# Patient Record
Sex: Female | Born: 1945 | Race: Black or African American | Hispanic: No | Smoking: Former smoker
Health system: Southern US, Community
[De-identification: ages and names within clinical notes are randomized; demographics above are authoritative.]

## PROBLEM LIST (undated history)

## (undated) DIAGNOSIS — I10 Essential (primary) hypertension: Secondary | ICD-10-CM

## (undated) HISTORY — PX: BACK SURGERY: SHX140

## (undated) HISTORY — PX: ABDOMINAL HYSTERECTOMY: SHX81

## (undated) HISTORY — DX: Essential (primary) hypertension: I10

## (undated) HISTORY — PX: BRAIN TUMOR EXCISION: SHX577

---

## 1997-12-14 ENCOUNTER — Other Ambulatory Visit: Admission: RE | Admit: 1997-12-14 | Discharge: 1997-12-14 | Payer: Self-pay | Admitting: Obstetrics

## 1998-09-19 ENCOUNTER — Ambulatory Visit (HOSPITAL_COMMUNITY): Admission: RE | Admit: 1998-09-19 | Discharge: 1998-09-19 | Payer: Self-pay | Admitting: *Deleted

## 1998-09-19 ENCOUNTER — Encounter: Payer: Self-pay | Admitting: Neurosurgery

## 1999-03-12 ENCOUNTER — Encounter: Payer: Self-pay | Admitting: Nephrology

## 1999-03-12 ENCOUNTER — Encounter: Admission: RE | Admit: 1999-03-12 | Discharge: 1999-03-12 | Payer: Self-pay | Admitting: Nephrology

## 1999-08-27 ENCOUNTER — Emergency Department (HOSPITAL_COMMUNITY): Admission: EM | Admit: 1999-08-27 | Discharge: 1999-08-27 | Payer: Self-pay | Admitting: Emergency Medicine

## 1999-08-28 ENCOUNTER — Encounter: Payer: Self-pay | Admitting: Emergency Medicine

## 2003-03-23 ENCOUNTER — Other Ambulatory Visit: Admission: RE | Admit: 2003-03-23 | Discharge: 2003-03-23 | Payer: Self-pay | Admitting: Nephrology

## 2003-12-23 ENCOUNTER — Encounter: Admission: RE | Admit: 2003-12-23 | Discharge: 2003-12-23 | Payer: Self-pay | Admitting: Gastroenterology

## 2004-11-12 ENCOUNTER — Encounter: Admission: RE | Admit: 2004-11-12 | Discharge: 2004-11-12 | Payer: Self-pay | Admitting: Nephrology

## 2004-11-13 ENCOUNTER — Other Ambulatory Visit: Admission: RE | Admit: 2004-11-13 | Discharge: 2004-11-13 | Payer: Self-pay | Admitting: Nephrology

## 2004-11-13 ENCOUNTER — Other Ambulatory Visit: Admission: RE | Admit: 2004-11-13 | Discharge: 2004-11-13 | Payer: Self-pay | Admitting: Cardiology

## 2007-03-13 ENCOUNTER — Ambulatory Visit (HOSPITAL_COMMUNITY): Admission: RE | Admit: 2007-03-13 | Discharge: 2007-03-13 | Payer: Self-pay | Admitting: Nephrology

## 2007-03-16 ENCOUNTER — Ambulatory Visit (HOSPITAL_COMMUNITY): Admission: RE | Admit: 2007-03-16 | Discharge: 2007-03-16 | Payer: Self-pay | Admitting: Nephrology

## 2007-03-23 ENCOUNTER — Other Ambulatory Visit: Admission: RE | Admit: 2007-03-23 | Discharge: 2007-03-23 | Payer: Self-pay | Admitting: Nephrology

## 2007-05-28 ENCOUNTER — Encounter (INDEPENDENT_AMBULATORY_CARE_PROVIDER_SITE_OTHER): Payer: Self-pay | Admitting: Nephrology

## 2007-05-28 ENCOUNTER — Ambulatory Visit (HOSPITAL_COMMUNITY): Admission: RE | Admit: 2007-05-28 | Discharge: 2007-05-28 | Payer: Self-pay | Admitting: Nephrology

## 2007-05-28 ENCOUNTER — Ambulatory Visit: Payer: Self-pay | Admitting: Vascular Surgery

## 2007-06-04 ENCOUNTER — Ambulatory Visit (HOSPITAL_COMMUNITY): Admission: RE | Admit: 2007-06-04 | Discharge: 2007-06-04 | Payer: Self-pay | Admitting: Nephrology

## 2007-10-09 ENCOUNTER — Encounter: Admission: RE | Admit: 2007-10-09 | Discharge: 2007-10-09 | Payer: Self-pay | Admitting: Nephrology

## 2008-03-22 ENCOUNTER — Encounter: Admission: RE | Admit: 2008-03-22 | Discharge: 2008-03-22 | Payer: Self-pay | Admitting: Neurosurgery

## 2008-09-06 ENCOUNTER — Other Ambulatory Visit: Admission: RE | Admit: 2008-09-06 | Discharge: 2008-09-06 | Payer: Self-pay | Admitting: Nephrology

## 2008-11-03 ENCOUNTER — Emergency Department (HOSPITAL_COMMUNITY): Admission: EM | Admit: 2008-11-03 | Discharge: 2008-11-03 | Payer: Self-pay | Admitting: Emergency Medicine

## 2008-11-21 ENCOUNTER — Encounter: Admission: RE | Admit: 2008-11-21 | Discharge: 2008-11-21 | Payer: Self-pay | Admitting: Nephrology

## 2009-02-15 ENCOUNTER — Ambulatory Visit (HOSPITAL_BASED_OUTPATIENT_CLINIC_OR_DEPARTMENT_OTHER): Admission: RE | Admit: 2009-02-15 | Discharge: 2009-02-15 | Payer: Self-pay | Admitting: Orthopedic Surgery

## 2009-04-15 ENCOUNTER — Ambulatory Visit (HOSPITAL_COMMUNITY)
Admission: RE | Admit: 2009-04-15 | Discharge: 2009-04-15 | Payer: Self-pay | Admitting: Physical Medicine and Rehabilitation

## 2010-04-21 ENCOUNTER — Encounter: Payer: Self-pay | Admitting: Nephrology

## 2010-06-17 LAB — CREATININE, SERUM: GFR calc Af Amer: >60 mL/min (ref >60)

## 2010-06-19 ENCOUNTER — Other Ambulatory Visit: Payer: Self-pay | Admitting: Nephrology

## 2010-06-21 ENCOUNTER — Ambulatory Visit
Admission: RE | Admit: 2010-06-21 | Discharge: 2010-06-21 | Disposition: A | Discharge status: Home / Self Care | Payer: Self-pay | Source: Ambulatory Visit | Attending: Nephrology | Admitting: Nephrology

## 2010-07-07 LAB — URINALYSIS, ROUTINE W REFLEX MICROSCOPIC
Glucose, UA: NEGATIVE mg/dL
Hgb urine dipstick: NEGATIVE
Nitrite: NEGATIVE
Urobilinogen, UA: 1 mg/dL (ref 0.0–1.0)
pH: 5 (ref 5.0–8.0)

## 2010-07-07 LAB — POCT I-STAT, CHEM 8
BUN: 15 mg/dL (ref 6–23)
Creatinine, Ser: 0.8 mg/dL (ref 0.4–1.2)
Glucose, Bld: 129 mg/dL — ABNORMAL HIGH (ref 70–99)
Potassium: 3.2 mEq/L — ABNORMAL LOW (ref 3.5–5.1)
Sodium: 142 mEq/L (ref 135–145)

## 2010-07-07 LAB — URINE MICROSCOPIC-ADD ON

## 2011-02-02 ENCOUNTER — Emergency Department (HOSPITAL_COMMUNITY)
Admission: EM | Admit: 2011-02-02 | Discharge: 2011-02-02 | Disposition: A | Discharge status: Home / Self Care | Payer: Self-pay | Attending: Emergency Medicine | Admitting: Emergency Medicine

## 2011-02-02 ENCOUNTER — Emergency Department (HOSPITAL_COMMUNITY): Payer: Self-pay

## 2011-02-02 DIAGNOSIS — R51 Headache: Secondary | ICD-10-CM | POA: Insufficient documentation

## 2011-02-02 DIAGNOSIS — Z79899 Other long term (current) drug therapy: Secondary | ICD-10-CM | POA: Insufficient documentation

## 2011-02-02 DIAGNOSIS — I10 Essential (primary) hypertension: Secondary | ICD-10-CM | POA: Insufficient documentation

## 2011-02-02 DIAGNOSIS — M129 Arthropathy, unspecified: Secondary | ICD-10-CM | POA: Insufficient documentation

## 2011-02-02 DIAGNOSIS — H9319 Tinnitus, unspecified ear: Secondary | ICD-10-CM | POA: Insufficient documentation

## 2014-11-16 ENCOUNTER — Encounter: Payer: Self-pay | Admitting: Skilled Nursing Facility1

## 2014-11-16 ENCOUNTER — Encounter: Payer: Medicare Other | Attending: Family Medicine | Admitting: Skilled Nursing Facility1

## 2014-11-16 VITALS — Ht 64.0 in | Wt 177.0 lb

## 2014-11-16 DIAGNOSIS — E119 Type 2 diabetes mellitus without complications: Secondary | ICD-10-CM | POA: Insufficient documentation

## 2014-11-16 DIAGNOSIS — Z713 Dietary counseling and surveillance: Secondary | ICD-10-CM | POA: Insufficient documentation

## 2014-11-16 NOTE — Progress Notes (Signed)
Diabetes Self-Management Education  Visit Type: First/Initial  Appt. Start Time: 2:00 Appt. End Time: 3:30  11/16/2014  Ms. Wanda Sanders, identified by name and date of birth, is a 69 y.o. female with a diagnosis of Diabetes: Type 2.   ASSESSMENT  Height  (1.626 m), weight 177 lb (80.287 kg). Body mass index is 30.37 kg/(m^2).  Pt stated she keeps getting an error message on her glucometer when dietitian offered to show her how to use one she declined so the dietitian verbally explained how to properly check her glucose and to push the test strip all the way in.      Diabetes Self-Management Education - 11/16/14 1407    Visit Information   Visit Type First/Initial   Initial Visit   Diabetes Type Type 2   Are you currently following a meal plan? No   Are you taking your medications as prescribed? Yes   Date Diagnosed this year (2016) May   Health Coping   How would you rate your overall health? Fair   Psychosocial Assessment   Patient Belief/Attitude about Diabetes Motivated to manage diabetes   Self-care barriers None   Self-management support Family;Friends   Patient Concerns Nutrition/Meal planning;Glycemic Control   Special Needs None   Preferred Learning Style Auditory;Visual   Learning Readiness Ready   How often do you need to have someone help you when you read instructions, pamphlets, or other written materials from your doctor or pharmacy? 1 - Never   Complications   Last HgB A1C per patient/outside source 7.2 %   How often do you check your blood sugar? 0 times/day (not testing)   Number of hypoglycemic episodes per month 0   Number of hyperglycemic episodes per week 0   Have you had a dilated eye exam in the past 12 months? No  But an appoinment is made   Have you had a dental exam in the past 12 months? Yes   Are you checking your feet? No   Dietary Intake   Breakfast none----sunday morning: eggs, grits   Lunch none   Dinner vegetables   Beverage(s)  water, juice, coffee   Exercise   Exercise Type ADL's   Patient Education   Previous Diabetes Education Yes (please comment)   Disease state  Definition of diabetes, type 1 and 2, and the diagnosis of diabetes   Nutrition management  Role of diet in the treatment of diabetes and the relationship between the three main macronutrients and blood glucose level;Food label reading, portion sizes and measuring food.;Carbohydrate counting;Meal timing in regards to the patients' current diabetes medication.   Physical activity and exercise  Role of exercise on diabetes management, blood pressure control and cardiac health.;Identified with patient nutritional and/or medication changes necessary with exercise.   Monitoring Daily foot exams;Yearly dilated eye exam   Chronic complications Retinopathy and reason for yearly dilated eye exams   Individualized Goals (developed by patient)   Nutrition Follow meal plan discussed;General guidelines for healthy choices and portions discussed;Adjust meds/carbs with exercise as discussed   Physical Activity --  be physically active every day   Medications take my medication as prescribed  metformin with food   Monitoring  test my blood glucose as discussed  at least once a day   Reducing Risk do foot checks daily   Outcomes   Expected Outcomes Demonstrated interest in learning. Expect positive outcomes   Future DMSE PRN   Program Status Completed      Individualized  Plan for Diabetes Self-Management Training:   Learning Objective:  Patient will have a greater understanding of diabetes self-management. Patient education plan is to attend individual and/or group sessions per assessed needs and concerns.   Plan:   There are no Patient Instructions on file for this visit.  Expected Outcomes:  Demonstrated interest in learning. Expect positive outcomes  Education material provided: Living Well with Diabetes, Meal plan card and Snack sheet  If problems or  questions, patient to contact team via:  Phone  Future DSME appointment: PRN

## 2015-10-14 ENCOUNTER — Emergency Department (HOSPITAL_COMMUNITY)
Admission: EM | Admit: 2015-10-14 | Discharge: 2015-10-14 | Disposition: A | Discharge status: Home / Self Care | Payer: Medicare Other | Attending: Emergency Medicine | Admitting: Emergency Medicine

## 2015-10-14 ENCOUNTER — Encounter (HOSPITAL_COMMUNITY): Payer: Self-pay | Admitting: Emergency Medicine

## 2015-10-14 DIAGNOSIS — R300 Dysuria: Secondary | ICD-10-CM

## 2015-10-14 DIAGNOSIS — Z7984 Long term (current) use of oral hypoglycemic drugs: Secondary | ICD-10-CM | POA: Insufficient documentation

## 2015-10-14 DIAGNOSIS — R319 Hematuria, unspecified: Secondary | ICD-10-CM | POA: Insufficient documentation

## 2015-10-14 DIAGNOSIS — N390 Urinary tract infection, site not specified: Secondary | ICD-10-CM

## 2015-10-14 DIAGNOSIS — I10 Essential (primary) hypertension: Secondary | ICD-10-CM | POA: Diagnosis not present

## 2015-10-14 DIAGNOSIS — Z87891 Personal history of nicotine dependence: Secondary | ICD-10-CM | POA: Diagnosis not present

## 2015-10-14 LAB — URINALYSIS, ROUTINE W REFLEX MICROSCOPIC
Glucose, UA: NEGATIVE mg/dL
KETONES UR: 15 mg/dL — AB
NITRITE: POSITIVE — AB
PH: 5 (ref 5.0–8.0)
Protein, ur: >300 mg/dL — AB
SPECIFIC GRAVITY, URINE: 1.029 (ref 1.005–1.030)

## 2015-10-14 LAB — URINE MICROSCOPIC-ADD ON

## 2015-10-14 MED ORDER — CEPHALEXIN 500 MG PO CAPS: 500.0000 mg | ORAL_CAPSULE | Freq: Four times a day (QID) | ORAL | Status: DC

## 2015-10-14 MED ORDER — CEPHALEXIN 250 MG PO CAPS
500.0000 mg | ORAL_CAPSULE | Freq: Once | ORAL | Status: AC
  Administered 2015-10-14: 500 mg via ORAL
  Filled 2015-10-14: qty 2

## 2015-10-14 MED ORDER — PHENAZOPYRIDINE HCL 200 MG PO TABS: 200.0000 mg | ORAL_TABLET | Freq: Three times a day (TID) | ORAL | Status: DC | PRN

## 2015-10-14 NOTE — Discharge Instructions (Signed)

## 2015-10-14 NOTE — ED Provider Notes (Signed)
CSN: [id number]     Arrival date & time 10/14/15  1941 History   First MD Initiated Contact with Patient 10/14/15 2201     Chief Complaint  Patient presents with  . Hematuria     (Consider location/radiation/quality/duration/timing/severity/associated sxs/prior Treatment) HPI Comments: Patient presents to the ED with a chief complaint of hematuria.  She reports associated dysuria.  She states that the symptoms started yesterday.  She denies any associated fevers, chills, nausea, or vomiting, abdominal pain or vaginal discharge.  She has not tried taking anything for her symptoms.  There are no modifying factors.  The history is provided by the patient. No language interpreter was used.    Past Medical History  Diagnosis Date  . Hypertension    Past Surgical History  Procedure Laterality Date  . Brain tumor excision    . Abdominal hysterectomy    . Back surgery     Family History  Problem Relation Age of Onset  . Hypertension Other   . Diabetes Other    Social History  Substance Use Topics  . Smoking status: Former Games developermoker  . Smokeless tobacco: None  . Alcohol Use: No   OB History    No data available     Review of Systems  Genitourinary: Positive for dysuria and hematuria.  All other systems reviewed and are negative.     Allergies  Iohexol  Home Medications   Prior to Admission medications   Medication Sig Start Date End Date Taking? Authorizing Provider  amLODipine-benazepril (LOTREL) 5-10 MG per capsule Take 1 capsule by mouth daily.    Historical Provider, MD  metFORMIN (GLUCOPHAGE) 500 MG tablet Take by mouth 2 (two) times daily with a meal.    Historical Provider, MD   BP 123/68 mmHg  Pulse 72  Temp(Src) 98.2 F (36.8 C) (Oral)  Resp 16  Ht 5\' 4"  (1.626 m)  Wt 83.972 kg  BMI 31.76 kg/m2  SpO2 98% Physical Exam  Constitutional: She is oriented to person, place, and time. She appears well-developed and well-nourished.  HENT:  Head: Normocephalic  and atraumatic.  Eyes: Conjunctivae and EOM are normal. Pupils are equal, round, and reactive to light.  Neck: Normal range of motion. Neck supple.  Cardiovascular: Normal rate and regular rhythm.  Exam reveals no gallop and no friction rub.   No murmur heard. Pulmonary/Chest: Effort normal and breath sounds normal. No respiratory distress. She has no wheezes. She has no rales. She exhibits no tenderness.  Abdominal: Soft. Bowel sounds are normal. She exhibits no distension and no mass. There is no tenderness. There is no rebound and no guarding.  Musculoskeletal: Normal range of motion. She exhibits no edema or tenderness.  Neurological: She is alert and oriented to person, place, and time.  Skin: Skin is warm and dry.  Psychiatric: She has a normal mood and affect. Her behavior is normal. Judgment and thought content normal.  Nursing note and vitals reviewed.   ED Course  Procedures (including critical care time) Labs Review Labs Reviewed  URINALYSIS, ROUTINE W REFLEX MICROSCOPIC (NOT AT [facility]) - Abnormal; Notable for the following:    Color, Urine RED (*)    APPearance TURBID (*)    Hgb urine dipstick LARGE (*)    Bilirubin Urine MODERATE (*)    Ketones, ur 15 (*)    Protein, ur >300 (*)    Nitrite POSITIVE (*)    Leukocytes, UA MODERATE (*)    All other components within normal limits  URINE MICROSCOPIC-ADD ON - Abnormal; Notable for the following:    Squamous Epithelial / LPF 6-30 (*)    Bacteria, UA MANY (*)    All other components within normal limits      MDM   Final diagnoses:  Dysuria  2. UTI  Patient with hematuria and dysuria x 2 days.  No fevers or vomiting.  No abdominal pain.  VSS.  Afebrile.  Patient is well appearing.  Will treat with keflex for UTI.  Will send urine for culture.  Recommend PCP follow-up to ensure resolution of symptoms.    Roxy Horseman, PA-C 10/14/15 2243  Bethann Berkshire, MD 10/14/15 2249

## 2015-10-14 NOTE — ED Notes (Signed)
Pt to ED with c/o hematuria since yesterday.  Also c/o burning with urination.  St's worse today.  Pt denies fever, nausea or vomiting

## 2017-09-18 ENCOUNTER — Encounter (HOSPITAL_COMMUNITY): Payer: Self-pay | Admitting: Family Medicine

## 2017-09-18 ENCOUNTER — Ambulatory Visit (HOSPITAL_COMMUNITY)
Admission: EM | Admit: 2017-09-18 | Discharge: 2017-09-18 | Disposition: A | Discharge status: Home / Self Care | Payer: Medicare Other | Attending: Family Medicine | Admitting: Family Medicine

## 2017-09-18 DIAGNOSIS — S39012A Strain of muscle, fascia and tendon of lower back, initial encounter: Secondary | ICD-10-CM

## 2017-09-18 MED ORDER — TIZANIDINE HCL 2 MG PO TABS: 2.0000 mg | ORAL_TABLET | Freq: Two times a day (BID) | ORAL | 0 refills | Status: DC | PRN

## 2017-09-18 MED ORDER — MELOXICAM 7.5 MG PO TABS: 7.5000 mg | ORAL_TABLET | Freq: Every day | ORAL | 0 refills | Status: AC
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
## 2017-09-18 NOTE — Discharge Instructions (Signed)
Light and regular activity as tolerated. Meloxicam once a day, take with food.  Zanaflex at night, may cause drowsiness. See exercises provided. Please follow up with your primary care provider for recheck in the next 2 weeks.

## 2017-11-13 ENCOUNTER — Other Ambulatory Visit: Payer: Self-pay | Admitting: Family Medicine

## 2017-11-13 DIAGNOSIS — Z1231 Encounter for screening mammogram for malignant neoplasm of breast: Secondary | ICD-10-CM

## 2017-11-13 DIAGNOSIS — E2839 Other primary ovarian failure: Secondary | ICD-10-CM

## 2017-12-30 ENCOUNTER — Encounter (HOSPITAL_COMMUNITY): Payer: Self-pay | Admitting: Emergency Medicine

## 2017-12-30 ENCOUNTER — Ambulatory Visit (HOSPITAL_COMMUNITY)
Admission: EM | Admit: 2017-12-30 | Discharge: 2017-12-30 | Disposition: A | Discharge status: Home / Self Care | Payer: Medicare Other | Attending: Family Medicine | Admitting: Family Medicine

## 2017-12-30 ENCOUNTER — Ambulatory Visit (INDEPENDENT_AMBULATORY_CARE_PROVIDER_SITE_OTHER): Payer: Medicare Other

## 2017-12-30 ENCOUNTER — Other Ambulatory Visit: Payer: Self-pay

## 2017-12-30 DIAGNOSIS — M1611 Unilateral primary osteoarthritis, right hip: Secondary | ICD-10-CM | POA: Diagnosis not present

## 2017-12-30 DIAGNOSIS — I10 Essential (primary) hypertension: Secondary | ICD-10-CM

## 2017-12-30 DIAGNOSIS — E119 Type 2 diabetes mellitus without complications: Secondary | ICD-10-CM

## 2017-12-30 DIAGNOSIS — M25551 Pain in right hip: Secondary | ICD-10-CM | POA: Diagnosis not present

## 2017-12-30 DIAGNOSIS — E785 Hyperlipidemia, unspecified: Secondary | ICD-10-CM

## 2017-12-30 MED ORDER — METHYLPREDNISOLONE 4 MG PO TBPK: ORAL_TABLET | ORAL | 0 refills | Status: DC

## 2017-12-30 NOTE — ED Provider Notes (Signed)
[facility]    CSN: [id number] Arrival date & time: 12/30/17  1106     History   Chief Complaint Chief Complaint  Patient presents with  . Groin Pain    right    HPI Wanda Sanders is a 72 y.o. female.   HPI She is here for right groin and hip pain.  It is been there since Sunday.  No fall or injury.  No change in activity.  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  She is never had hip pain before.  She has known arthritis in her low back.  She has no numbness or weakness.  When she steps full weight on the right leg, and pivots at all she feels like the leg will collapse.  She states it feels like it "catches". When she rolls over at night it will awaken her. No prior hip problems.  No history of steroid use. Past Medical History:  Diagnosis Date  . Hypertension     Patient Active Problem List   Diagnosis Date Noted  . Essential hypertension 12/30/2017  . HLD (hyperlipidemia) 12/30/2017  . Type 2 diabetes mellitus (HCC) 12/30/2017    Past Surgical History:  Procedure Laterality Date  . ABDOMINAL HYSTERECTOMY    . BACK SURGERY    . BRAIN TUMOR EXCISION      OB History   None      Home Medications    Prior to Admission medications   Medication Sig Start Date End Date Taking? Authorizing Provider  amLODipine (NORVASC) 5 MG tablet Take 5 mg by mouth daily. 10/10/15  Yes [provider]  metFORMIN (GLUCOPHAGE-XR) 500 MG 24 hr tablet Take 500 mg by mouth daily. 10/13/15  Yes [provider]  Naproxen Sodium (ALEVE) 220 MG CAPS Take by mouth.   Yes [provider]  atorvastatin (LIPITOR) 10 MG tablet Take 10 mg by mouth daily. 08/01/15   [provider]  CALCIUM PO Take 1 tablet by mouth daily.    [provider]  methylPREDNISolone (MEDROL DOSEPAK) 4 MG TBPK tablet tad 12/30/17   Eustace Moore, MD  tiZANidine (ZANAFLEX) 2 MG tablet Take 1 tablet (2 mg total) by  mouth 3 times/day as needed-between meals & bedtime for muscle spasms. 09/18/17   Georgetta Haber, NP    Family History Family History  Problem Relation Age of Onset  . Hypertension Other   . Diabetes Other   Hypertension diabetes in mother  Social History Social History   Tobacco Use  . Smoking status: Former Smoker  Substance Use Topics  . Alcohol use: No  . Drug use: No     Allergies   Iohexol   Review of Systems Review of Systems  Constitutional: Negative for chills and fever.  HENT: Negative for ear pain and sore throat.   Eyes: Negative for pain and visual disturbance.  Respiratory: Negative for cough and shortness of breath.   Cardiovascular: Negative for chest pain and palpitations.  Gastrointestinal: Negative for abdominal pain and vomiting.  Genitourinary: Negative for dysuria and hematuria.  Musculoskeletal: Positive for arthralgias and gait problem. Negative for back pain.  Skin: Negative for color change and rash.  Neurological: Negative for seizures and syncope.  All other systems reviewed and are negative.    Physical Exam Triage Vital Signs ED Triage Vitals  Enc Vitals Group     BP 12/30/17 1128 (!) 152/61     Pulse Rate  12/30/17 1128 68     Resp --      Temp 12/30/17 1128 98 F (36.7 C)     Temp Source 12/30/17 1128 Oral     SpO2 12/30/17 1128 100 %     Weight --      Height --      Head Circumference --      Peak Flow --      Pain Score 12/30/17 1125 7     Pain Loc --      Pain Edu? --      Excl. in GC? --    No data found.  Updated Vital Signs BP (!) 152/61 (BP Location: Right Arm)   Pulse 68   Temp 98 F (36.7 C) (Oral)   SpO2 100%   Visual Acuity Right Eye Distance:   Left Eye Distance:   Bilateral Distance:    Right Eye Near:   Left Eye Near:    Bilateral Near:     Physical Exam  Constitutional: She appears well-developed and well-nourished. No distress.  Appears uncomfortable, moves slowly.  Moderately antalgic  gait  HENT:  Head: Normocephalic and atraumatic.  Mouth/Throat: Oropharynx is clear and moist.  Eyes: Pupils are equal, round, and reactive to light. Conjunctivae are normal.  Neck: Normal range of motion.  Cardiovascular: Normal rate.  Pulmonary/Chest: Effort normal. No respiratory distress.  Abdominal: Soft. She exhibits no distension.  Musculoskeletal: Normal range of motion. She exhibits no edema.  Patient has pain with hip flexion and internal rotation.  Limited range of motion. Strength, sensation, range of motion and reflexes normal bilateral lower extremities  Neurological: She is alert.  Skin: Skin is warm and dry.  Vitals reviewed.    UC Treatments / Results  Labs (all labs ordered are listed, but only abnormal results are displayed) Labs Reviewed - No data to display  EKG None  Radiology Dg Hip Unilat W Or Wo Pelvis 2-3 Views Right  Result Date: 12/30/2017 CLINICAL DATA:  Right hip pain.  Limping. EXAM: DG HIP (WITH OR WITHOUT PELVIS) 2-3V RIGHT COMPARISON:  Lumbar spine 09/12/2014.  Right hip 09/12/2014. FINDINGS: Degenerative changes lumbar spine and both hips. No acute bony or joint abnormality identified. No evidence of fracture or dislocation. Aortoiliac atherosclerotic vascular calcification. Pelvic calcifications consistent with phleboliths. IMPRESSION: Degenerative changes lumbar spine and both hips. No acute abnormality. Electronically Signed   By: Maisie Fus  Register   On: 12/30/2017 12:30    Procedures Procedures (including critical care time)  Medications Ordered in UC Medications - No data to display  Initial Impression / Assessment and Plan / UC Course  I have reviewed the triage vital signs and the nursing notes.  Pertinent labs & imaging results that were available during my care of the patient were reviewed by me and considered in my medical decision making (see chart for details).     Discussed arthritis of hip.  Will try Medrol Dosepak.   Follow-up with Ortho if fails to improve Final Clinical Impressions(s) / UC Diagnoses   Final diagnoses:  Acute hip pain, right  Primary osteoarthritis of right hip     Discharge Instructions     Take the medrol dosepak Take all of day one today This is a strong anti inflammatory medicine Limit walking when hip painful Follow up with your PCP, or see an orthopedic if hip fails to improve    ED Prescriptions    Medication Sig Dispense Auth. Provider   methylPREDNISolone (MEDROL DOSEPAK) 4  MG TBPK tablet tad 21 tablet Eustace Moore, MD     Controlled Substance Prescriptions [place] Controlled Substance Registry consulted? Not Applicable   Eustace Moore, MD 12/30/17 1318

## 2017-12-30 NOTE — ED Triage Notes (Signed)
Pt reports waking up Sunday morning with pain in her right hip/groin.  She states it is muscular or joint related but denies any injury.

## 2017-12-30 NOTE — Discharge Instructions (Signed)
Take the medrol dosepak Take all of day one today This is a strong anti inflammatory medicine Limit walking when hip painful Follow up with your PCP, or see an orthopedic if hip fails to improve

## 2018-01-01 ENCOUNTER — Ambulatory Visit
Admission: RE | Admit: 2018-01-01 | Discharge: 2018-01-01 | Disposition: A | Discharge status: Home / Self Care | Payer: Medicare Other | Source: Ambulatory Visit | Attending: Family Medicine | Admitting: Family Medicine

## 2018-01-01 DIAGNOSIS — Z1231 Encounter for screening mammogram for malignant neoplasm of breast: Secondary | ICD-10-CM

## 2018-01-01 DIAGNOSIS — E2839 Other primary ovarian failure: Secondary | ICD-10-CM

## 2018-11-23 ENCOUNTER — Other Ambulatory Visit: Payer: Self-pay | Admitting: Family Medicine

## 2018-11-23 DIAGNOSIS — Z1231 Encounter for screening mammogram for malignant neoplasm of breast: Secondary | ICD-10-CM

## 2019-01-05 ENCOUNTER — Ambulatory Visit: Payer: Medicare Other

## 2019-01-06 ENCOUNTER — Ambulatory Visit
Admission: RE | Admit: 2019-01-06 | Discharge: 2019-01-06 | Disposition: A | Discharge status: Home / Self Care | Payer: Medicare Other | Source: Ambulatory Visit | Attending: Family Medicine | Admitting: Family Medicine

## 2019-01-06 ENCOUNTER — Other Ambulatory Visit: Payer: Self-pay

## 2019-01-06 DIAGNOSIS — Z1231 Encounter for screening mammogram for malignant neoplasm of breast: Secondary | ICD-10-CM

## 2019-11-29 ENCOUNTER — Other Ambulatory Visit: Payer: Self-pay | Admitting: Family Medicine

## 2019-11-29 DIAGNOSIS — Z1231 Encounter for screening mammogram for malignant neoplasm of breast: Secondary | ICD-10-CM

## 2020-01-07 ENCOUNTER — Ambulatory Visit: Payer: Medicare Other

## 2020-01-24 ENCOUNTER — Ambulatory Visit
Admission: RE | Admit: 2020-01-24 | Discharge: 2020-01-24 | Disposition: A | Discharge status: Home / Self Care | Payer: Medicare (Managed Care) | Source: Ambulatory Visit | Attending: Family Medicine | Admitting: Family Medicine

## 2020-01-24 ENCOUNTER — Other Ambulatory Visit: Payer: Self-pay

## 2020-01-24 DIAGNOSIS — Z1231 Encounter for screening mammogram for malignant neoplasm of breast: Secondary | ICD-10-CM

## 2020-12-18 ENCOUNTER — Other Ambulatory Visit: Payer: Self-pay | Admitting: Family Medicine

## 2020-12-18 DIAGNOSIS — Z1231 Encounter for screening mammogram for malignant neoplasm of breast: Secondary | ICD-10-CM

## 2021-01-25 ENCOUNTER — Ambulatory Visit: Payer: Medicare (Managed Care)

## 2021-02-21 ENCOUNTER — Ambulatory Visit: Payer: Medicare (Managed Care)

## 2021-02-28 ENCOUNTER — Other Ambulatory Visit: Payer: Self-pay

## 2021-02-28 ENCOUNTER — Ambulatory Visit
Admission: RE | Admit: 2021-02-28 | Discharge: 2021-02-28 | Disposition: A | Discharge status: Home / Self Care | Payer: Medicare (Managed Care) | Source: Ambulatory Visit | Attending: Family Medicine | Admitting: Family Medicine

## 2021-02-28 DIAGNOSIS — Z1231 Encounter for screening mammogram for malignant neoplasm of breast: Secondary | ICD-10-CM

## 2022-01-22 ENCOUNTER — Other Ambulatory Visit: Payer: Self-pay | Admitting: Family Medicine

## 2022-01-22 DIAGNOSIS — Z1231 Encounter for screening mammogram for malignant neoplasm of breast: Secondary | ICD-10-CM

## 2022-02-27 ENCOUNTER — Ambulatory Visit: Payer: No Typology Code available for payment source | Admitting: Physical Therapy

## 2022-03-18 ENCOUNTER — Ambulatory Visit: Payer: Medicare (Managed Care)

## 2022-05-10 ENCOUNTER — Ambulatory Visit
Admission: RE | Admit: 2022-05-10 | Discharge: 2022-05-10 | Disposition: A | Discharge status: Home / Self Care | Payer: No Typology Code available for payment source | Source: Ambulatory Visit | Attending: Family Medicine | Admitting: Family Medicine

## 2022-05-10 DIAGNOSIS — Z1231 Encounter for screening mammogram for malignant neoplasm of breast: Secondary | ICD-10-CM

## 2022-08-14 ENCOUNTER — Emergency Department (HOSPITAL_COMMUNITY)
Admission: EM | Admit: 2022-08-14 | Discharge: 2022-08-14 | Disposition: A | Discharge status: Home / Self Care | Payer: No Typology Code available for payment source | Attending: Emergency Medicine | Admitting: Emergency Medicine

## 2022-08-14 ENCOUNTER — Other Ambulatory Visit: Payer: Self-pay

## 2022-08-14 ENCOUNTER — Emergency Department (HOSPITAL_COMMUNITY): Payer: No Typology Code available for payment source

## 2022-08-14 DIAGNOSIS — M545 Low back pain, unspecified: Secondary | ICD-10-CM | POA: Diagnosis not present

## 2022-08-14 DIAGNOSIS — Z7984 Long term (current) use of oral hypoglycemic drugs: Secondary | ICD-10-CM | POA: Insufficient documentation

## 2022-08-14 DIAGNOSIS — Z79899 Other long term (current) drug therapy: Secondary | ICD-10-CM | POA: Insufficient documentation

## 2022-08-14 DIAGNOSIS — M542 Cervicalgia: Secondary | ICD-10-CM | POA: Diagnosis not present

## 2022-08-14 DIAGNOSIS — I10 Essential (primary) hypertension: Secondary | ICD-10-CM | POA: Insufficient documentation

## 2022-08-14 DIAGNOSIS — E119 Type 2 diabetes mellitus without complications: Secondary | ICD-10-CM | POA: Insufficient documentation

## 2022-08-14 DIAGNOSIS — W01198A Fall on same level from slipping, tripping and stumbling with subsequent striking against other object, initial encounter: Secondary | ICD-10-CM | POA: Diagnosis not present

## 2022-08-14 DIAGNOSIS — Y92511 Restaurant or cafe as the place of occurrence of the external cause: Secondary | ICD-10-CM | POA: Diagnosis not present

## 2022-08-14 DIAGNOSIS — W19XXXA Unspecified fall, initial encounter: Secondary | ICD-10-CM

## 2022-08-14 DIAGNOSIS — S0990XA Unspecified injury of head, initial encounter: Secondary | ICD-10-CM | POA: Insufficient documentation

## 2022-08-14 NOTE — ED Provider Notes (Signed)
[place] EMERGENCY DEPARTMENT AT [facility] Provider Note   CSN: [id number] Arrival date & time: 08/14/22  1221     History  Chief Complaint  Patient presents with   Wanda Sanders is a 77 y.o. female with HTN, HLD, T2DM presents with fall, head injury.   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 C/o neck and right-sided headache. Initially had some mild lower back pain but that is gone now. Denies visual changes, LOC, N/T, asymmetric weakness. Otherwise feels well and is in her NSOH.    Fall       Home Medications Prior to Admission medications   Medication Sig Start Date End Date Taking? Authorizing Provider  amLODipine (NORVASC) 5 MG tablet Take 5 mg by mouth daily. 10/10/15   [provider]  atorvastatin (LIPITOR) 10 MG tablet Take 10 mg by mouth daily. 08/01/15   [provider]  CALCIUM PO Take 1 tablet by mouth daily.    [provider]  metFORMIN (GLUCOPHAGE-XR) 500 MG 24 hr tablet Take 500 mg by mouth daily. 10/13/15   [provider]  methylPREDNISolone (MEDROL DOSEPAK) 4 MG TBPK tablet tad 12/30/17   Eustace Moore, MD  Naproxen Sodium (ALEVE) 220 MG CAPS Take by mouth.    [provider]  tiZANidine (ZANAFLEX) 2 MG tablet Take 1 tablet (2 mg total) by mouth 3 times/day as needed-between meals & bedtime for muscle spasms. 09/18/17   Georgetta Haber, NP      Allergies    Iohexol    Review of Systems   Review of Systems Review of systems Negative for LOC.  A 10 point review of systems was performed and is negative unless otherwise reported in HPI.  Physical Exam Updated Vital Signs BP (!) 155/77 (BP Location: Right Arm)   Pulse 62   Temp 97.9 F (36.6 C) (Oral)   Resp 14   Ht 5\' 4"  (1.626 m)   Wt 78 kg   SpO2 100%   BMI 29.52 kg/m  Physical Exam General: Normal appearing female, lying in bed.  HEENT: PERRLA,  Sclera anicteric, MMM, trachea midline. NCAT. No midline C-spine TTP/stepoffs. In EMS C-collar.  Cardiology: RRR, no murmurs/rubs/gallops.  Resp: Normal respiratory rate and effort. CTAB, no wheezes, rhonchi, crackles.  No chest wall tenderness palpation. Abd: Soft, non-tender, non-distended. No rebound tenderness or guarding.  Pelvis stable nontender MSK: No peripheral edema or signs of trauma. Extremities without deformity or TTP.  Skin: warm, dry.  Back: No T or L spine TTP/stepoffs Neuro: A&Ox4, CNs II-XII grossly intact. MAEs. Sensation grossly intact.  Psych: Normal mood and affect.   ED Results / Procedures / Treatments   Labs (all labs ordered are listed, but only abnormal results are displayed) Labs Reviewed - No data to display  EKG None  Radiology CT Head Wo Contrast  Result Date: 08/14/2022 CLINICAL DATA:  Status post fall. EXAM: CT HEAD WITHOUT CONTRAST CT CERVICAL SPINE WITHOUT CONTRAST TECHNIQUE: Multidetector CT imaging of the head and cervical spine was performed following the standard protocol without intravenous contrast. Multiplanar CT image reconstructions of the cervical spine were also generated. RADIATION DOSE REDUCTION: This exam was performed according to the departmental dose-optimization program which includes automated exposure control, adjustment of the mA and/or kV according to patient size and/or use of iterative reconstruction technique. COMPARISON:  Brain MRI from 06/09/2020. FINDINGS: CT HEAD  FINDINGS Brain: No evidence of acute infarction, hemorrhage, hydrocephalus, extra-axial collection or mass lesion/mass effect. Encephalomalacia within the right frontal lobe is unchanged from previous exam. There is patchy low-attenuation within the subcortical and periventricular white matter compatible with chronic microvascular disease. Vascular: No hyperdense vessel or unexpected calcification. Skull: Previous right frontal craniotomy. Negative for acute fracture or  focal lesion. Sinuses/Orbits: No acute finding. Other: No significant scalp hematoma CT CERVICAL SPINE FINDINGS Alignment: Normal. Skull base and vertebrae: No acute fracture. No primary bone lesion or focal pathologic process. Soft tissues and spinal canal: No prevertebral fluid or swelling. No visible canal hematoma. Disc levels: There is solid fusion of the C5-6 vertebra. Moderate degenerative disc disease noted at C3-4, C4-5, C6-7 and C7-T1. Upper chest: There is a large hypodense nodule within the left lobe of thyroid gland measuring at least 4.5 x 3.3 cm. Lung apices appear clear. Other: None IMPRESSION: 1. No acute intracranial abnormality. 2. Chronic small vessel ischemic disease and right frontal lobe encephalomalacia. 3. No evidence for cervical spine fracture or subluxation. 4. Cervical degenerative disc disease. 5. Large hypodense nodule within the left lobe of thyroid gland. This measures up to 4.5 cm. Recommend thyroid ultrasound (ref: J Am Coll Radiol. 2015 Feb;12(2): 143-50). Electronically Signed   By: Signa Kell M.D.   On: 08/14/2022 13:41   CT Cervical Spine Wo Contrast  Result Date: 08/14/2022 CLINICAL DATA:  Status post fall. EXAM: CT HEAD WITHOUT CONTRAST CT CERVICAL SPINE WITHOUT CONTRAST TECHNIQUE: Multidetector CT imaging of the head and cervical spine was performed following the standard protocol without intravenous contrast. Multiplanar CT image reconstructions of the cervical spine were also generated. RADIATION DOSE REDUCTION: This exam was performed according to the departmental dose-optimization program which includes automated exposure control, adjustment of the mA and/or kV according to patient size and/or use of iterative reconstruction technique. COMPARISON:  Brain MRI from 06/09/2020. FINDINGS: CT HEAD FINDINGS Brain: No evidence of acute infarction, hemorrhage, hydrocephalus, extra-axial collection or mass lesion/mass effect. Encephalomalacia within the right frontal lobe  is unchanged from previous exam. There is patchy low-attenuation within the subcortical and periventricular white matter compatible with chronic microvascular disease. Vascular: No hyperdense vessel or unexpected calcification. Skull: Previous right frontal craniotomy. Negative for acute fracture or focal lesion. Sinuses/Orbits: No acute finding. Other: No significant scalp hematoma CT CERVICAL SPINE FINDINGS Alignment: Normal. Skull base and vertebrae: No acute fracture. No primary bone lesion or focal pathologic process. Soft tissues and spinal canal: No prevertebral fluid or swelling. No visible canal hematoma. Disc levels: There is solid fusion of the C5-6 vertebra. Moderate degenerative disc disease noted at C3-4, C4-5, C6-7 and C7-T1. Upper chest: There is a large hypodense nodule within the left lobe of thyroid gland measuring at least 4.5 x 3.3 cm. Lung apices appear clear. Other: None IMPRESSION: 1. No acute intracranial abnormality. 2. Chronic small vessel ischemic disease and right frontal lobe encephalomalacia. 3. No evidence for cervical spine fracture or subluxation. 4. Cervical degenerative disc disease. 5. Large hypodense nodule within the left lobe of thyroid gland. This measures up to 4.5 cm. Recommend thyroid ultrasound (ref: J Am Coll Radiol. 2015 Feb;12(2): 143-50). Electronically Signed   By: Signa Kell M.D.   On: 08/14/2022 13:41    Procedures Procedures    Medications Ordered in ED Medications - No data to display  ED Course/ Medical Decision Making/ A&P  Medical Decision Making Amount and/or Complexity of Data Reviewed Radiology: ordered. Decision-making details documented in ED Course.    This patient presents to the ED for concern of fall/head trauma, this involves an extensive number of treatment options, and is a complaint that carries with it a high risk of complications and morbidity.  Patient is hemodynamically stable and very  well-appearing.  MDM:    DDX for trauma includes but is not limited to:  -Head Injury such as skull fx or ICH -patient did fall and hit her on the wall, with residual right-sided headache.  No blood thinners and no LOC, however given patient's age will perform CT head. -Spinal Cord or Vertebral injury -patient does complain of some neck pain though she has no midline C-spine tenderness palpation.  She has no focal neurodeficits either but does have pain and will obtain CT C-spine. -Fractures -no extremity deformities or pain. -Patient reports she tripped over her own feet because she was carrying something and could not see them, otherwise is in her normal state of health and feels normal.  Nothing unusual by the last few days.  Do not believe patient requires labs or other workup for the fall and so can receive imaging and if negative can be discharged.  Clinical Course as of 08/14/22 1346  Wed Aug 14, 2022  1344 CT Head Wo Contrast 1. No acute intracranial abnormality. 2. Chronic small vessel ischemic disease and right frontal lobe encephalomalacia. 3. No evidence for cervical spine fracture or subluxation. 4. Cervical degenerative disc disease. 5. Large hypodense nodule within the left lobe of thyroid gland. This measures up to 4.5 cm. Recommend thyroid ultrasound   [HN]  1344 Patient with acute abnormality of her CT head or C-spine.  She otherwise feels well and is stable for discharge.  She is informed of her thyroid nodule and recommendation for outpatient thyroid ultrasound.  Patient started to follow-up with PCP.  Given discharge instructions and return precautions, all questions answered to patient satisfaction. [HN]    Clinical Course User Index [HN] Loetta Rough, MD    Imaging Studies ordered: I ordered imaging studies including CT head and C-spine I independently visualized and interpreted imaging. I agree with the radiologist interpretation  Additional history  obtained from chart review.   Social Determinants of Health: Patient lives independently   Disposition:    Co morbidities that complicate the patient evaluation  Past Medical History:  Diagnosis Date   Hypertension      Medicines No orders of the defined types were placed in this encounter.   I have reviewed the patients home medicines and have made adjustments as needed  Problem List / ED Course: Problem List Items Addressed This Visit   None Visit Diagnoses     Fall, initial encounter    -  Primary                   This note was created using dictation software, which may contain spelling or grammatical errors.    Loetta Rough, MD 08/14/22 1346

## 2022-08-14 NOTE — ED Notes (Signed)
RN reviewed discharge instructions with pt, and reviewed any questions. Pt understands to follow up with PCP. Pt has no further questions.

## 2022-08-14 NOTE — ED Triage Notes (Signed)
Pt bib [facility] for fall. 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 C/o neck, rt side head, and lower back pain. No Neuro deficit, or loss of sensation. Denies LOC. A&Ox4. VSS.

## 2022-08-14 NOTE — Discharge Instructions (Signed)
Thank you for coming to [facility] Emergency Department. You were seen for fall and head injury. We did an exam, and imaging, and these showed no traumatic injury.  You did however have a thyroid nodule which will require an ultrasound as an outpatient. You can alternate taking Tylenol and ibuprofen as needed for pain. You can take 650mg  tylenol (acetaminophen) every 4-6 hours, and 600 mg ibuprofen 3 times a day.  Please follow up with your primary care provider within 1 week.   Do not hesitate to return to the ED or call 911 if you experience: -Worsening symptoms -Lightheadedness, passing out -Fevers/chills -Anything else that concerns you

## 2023-01-24 ENCOUNTER — Other Ambulatory Visit: Payer: Self-pay | Admitting: Otolaryngology

## 2023-01-24 DIAGNOSIS — E041 Nontoxic single thyroid nodule: Secondary | ICD-10-CM

## 2023-03-20 ENCOUNTER — Ambulatory Visit (INDEPENDENT_AMBULATORY_CARE_PROVIDER_SITE_OTHER): Payer: No Typology Code available for payment source | Admitting: Neurology

## 2023-03-20 ENCOUNTER — Encounter: Payer: Self-pay | Admitting: Neurology

## 2023-03-20 VITALS — BP 145/70 | HR 75 | Ht 64.0 in | Wt 181.0 lb

## 2023-03-20 DIAGNOSIS — Z9189 Other specified personal risk factors, not elsewhere classified: Secondary | ICD-10-CM | POA: Diagnosis not present

## 2023-03-20 DIAGNOSIS — R519 Headache, unspecified: Secondary | ICD-10-CM

## 2023-03-20 NOTE — Progress Notes (Signed)
SLEEP MEDICINE CLINIC    Provider:  Melvyn Novas, MD  Primary Care Physician:  Verlon Au, MD [address] [place] [address]     Referring Provider: Verlon Au, Md [address] [place],  [place] [address]          Chief Complaint according to patient   Patient presents with:     New Patient (Initial Visit)           HISTORY OF PRESENT ILLNESS:  Wanda Sanders is a 77 y.o. female patient who is seen upon referral on 03/20/2023 from [facility]  for an evaluation of nocturnal Headaches,  quote :  "Neuro changing meds to [facility] , if no response send to neuro" .(?)  THE PATIENT IS ESTALISHED WITH [facility]<   Onset  several months ago.  First thought to be related to uncontrolled hypertension, but the new medications were able to control HTN and Headaches have remained.  Patient is a diabetic, in chronic pain care through [facility],  Here now with ongoing nocturnal  headaches evey night, sometimes at 11-12 o clock, when she is in bed, she may not have slept yet ( she goes to bed at 9 Pm ) and she may have already slept.  Headaches ease off in  AM, but they are excruciating .  " My head hurts so much that I can't left my head off the pillow, I have to take my hands to move my head.  They start in the occipital area and behind the ear.  No nausea no tearing, no facial flush, no puffiness , no photo or phonophobia.  She has none of the headaches before she laid down.   She cannot describe the quality of these headaches.   BP here 145/ 70 mmHg. DM, history of a brain tumor. " 1990 Surgery at [facility].   CT head this year , 08-14-2022- she fell at work ( a I asked her about trauma, which she had denied ) Brain MRI from 06/09/2020.   FINDINGS: CT HEAD FINDINGS   Brain: No evidence of acute infarction, hemorrhage, hydrocephalus, extra-axial collection or mass lesion/mass effect. Encephalomalacia within the right  frontal lobe is unchanged from previous exam. There is patchy low-attenuation within the subcortical and periventricular white matter compatible with chronic microvascular disease.   Vascular: No hyperdense vessel or unexpected calcification.   Skull: Previous right frontal craniotomy. Negative for acute fracture or focal lesion.   Sinuses/Orbits: No acute finding.   Other: No significant scalp hematoma  Clinical Data: Follow-up benign brain tumor.  The cyst removed in  2000.  Headaches.    MRI HEAD WITHOUT AND WITH CONTRAST    Technique:  Multiplanar, multiecho pulse sequences of the brain and  surrounding structures were obtained according to standard protocol  without and with intravenous contrast    Contrast: 17 ml Multihance.    Comparison: Report from examination dated 09/19/1998.  The actual  images are no longer available.    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  There is atrophy and  gliosis along the a surgical approach to the frontal horn of the  right lateral ventricle.  There are mild chronic small vessel  changes in the hemispheric deep white matter.  No cortical or large  vessel infarction.  No sign of acute or subacute infarction.  No  evidence of residual or recurrent colloid cyst.  Ventricles are not  dilated.  The patient has the variation of prominent perivascular  spaces.  No extra-axial fluid collection.  No abnormal enhancement.  Pituitary gland is normal.  No significant sinus disease.    IMPRESSION:  Changes related to right frontal approach for previous colloid cyst  resection with atrophy and gliosis along operative approach.  No  sign of residual or recurrent colloid cyst.  No hydrocephalus.    Mild chronic small vessel change of the hemispheric deep white  matter elsewhere.    Chief concern according to patient :  evening and night time headache , since summer  2024.     I have the pleasure of seeing Wanda Sanders 03/20/23 a right-handed female with a possible sleep disorder.    Sleep relevant medical history:     Family medical /sleep history: no  other family member on CPAP with OSA, insomnia, sleep walkers.    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 Lives with her daughter.  No pets  The patient currently works daytime . Tobacco use; not.  ETOH use ; rarely,  Caffeine intake in form of Coffee( 2/ week ) Soda( /) Tea ( /) or energy drinks      Sleep habits are as follows: The patient's dinner time is between 5-6 PM. The patient goes to bed at 9.30 PM and continues to sleep for 8 hours, wakes for 2-3 bathroom breaks, the first time at 2 AM.   The preferred sleep position is supine , with the support of 1 pillows.  Dreams are reportedly present. .   The patient wakes up spontaneously/ 6  AM is the usual rise time. He/ She reports not feeling refreshed or restored in AM, she feels as if hit by a hammer , on both sides of the head and nape of the neck.  Review of Systems: Out of a complete 14 system review, the patient complains of only the following symptoms, and all other reviewed systems are negative.:  Fatigue, sleepiness ,  headaches - pain , sever nocturnal Nocturia    How likely are you to doze in the following situations: 0 = not likely, 1 = slight chance, 2 = moderate chance, 3 = high chance   Sitting and Reading? Watching Television? Sitting inactive in a public place (theater or meeting)? As a passenger in a car for an hour without a break? Lying down in the afternoon when circumstances permit? Sitting and talking to someone? Sitting quietly after lunch without alcohol? In a car, while stopped for a few minutes in traffic?  Social History   Socioeconomic History   Marital status: Divorced    Spouse name: Not on file   Number of children: Not on file   Years of education: Not  on file   Highest education level: Not on file  Occupational History   Not on file  Tobacco Use   Smoking status: Former   Smokeless tobacco: Not on file  Substance and Sexual Activity   Alcohol use: No   Drug use: No   Sexual activity: Not on file  Other Topics Concern   Not on file  Social History Narrative   Not on file   Social Drivers of Health   Financial Resource Strain: Not on file  Food Insecurity: Not on file  Transportation Needs: Not on file  Physical Activity: Not  on file  Stress: Not on file  Social Connections: Not on file    Family History  Problem Relation Age of Onset   Hypertension Other    Diabetes Other     Past Medical History:  Diagnosis Date   Hypertension     Past Surgical History:  Procedure Laterality Date   ABDOMINAL HYSTERECTOMY     BACK SURGERY     BRAIN TUMOR EXCISION       Current Outpatient Medications on File Prior to Visit  Medication Sig Dispense Refill   amLODipine (NORVASC) 5 MG tablet Take 5 mg by mouth daily.     atorvastatin (LIPITOR) 10 MG tablet Take 10 mg by mouth daily.     CALCIUM PO Take 1 tablet by mouth daily.     metFORMIN (GLUCOPHAGE-XR) 500 MG 24 hr tablet Take 500 mg by mouth daily.     methylPREDNISolone (MEDROL DOSEPAK) 4 MG TBPK tablet tad 21 tablet 0   Naproxen Sodium (ALEVE) 220 MG CAPS Take by mouth.     tiZANidine (ZANAFLEX) 2 MG tablet Take 1 tablet (2 mg total) by mouth 3 times/day as needed-between meals & bedtime for muscle spasms. 15 tablet 0   No current facility-administered medications on file prior to visit.    Allergies  Allergen Reactions   Iohexol      Desc: NAUSEA/VOMITING WITH 17 CC'S OF MRI CONTRAST MULTIHANCE, Onset Date: [id number]      DIAGNOSTIC DATA (LABS, IMAGING, TESTING) - I reviewed patient records, labs, notes, testing and imaging myself where available.  Lab Results  Component Value Date   HGB 11.5 (L) 02/15/2009   HCT 43.0 11/03/2008      Component Value  Date/Time   NA 142 11/03/2008 0110   K 3.2 (L) 11/03/2008 0110   CL 102 11/03/2008 0110   GLUCOSE 129 (H) 11/03/2008 0110   BUN 15 11/03/2008 0110   CREATININE 0.79 04/15/2009 0750   GFRNONAA >60 04/15/2009 0750   GFRAA  04/15/2009 0750    >60        The eGFR has been calculated using the MDRD equation. This calculation has not been validated in all clinical situations. eGFR's persistently <60 mL/min signify possible Chronic Kidney Disease.   No results found for: "CHOL", "HDL", "LDLCALC", "LDLDIRECT", "TRIG", "CHOLHDL" No results found for: "HGBA1C" No results found for: "VITAMINB12" No results found for: "TSH"  PHYSICAL EXAM:  Today's Vitals   03/20/23 1407  BP: (!) 145/70  Pulse: 75  Weight: 181 lb (82.1 kg)  Height: 5\' 4"  (1.626 m)   Body mass index is 31.07 kg/m.   Wt Readings from Last 3 Encounters:  03/20/23 181 lb (82.1 kg)  08/14/22 172 lb (78 kg)  10/14/15 185 lb 2 oz (84 kg)     Ht Readings from Last 3 Encounters:  03/20/23 5\' 4"  (1.626 m)  08/14/22 5\' 4"  (1.626 m)  10/14/15 5\' 4"  (1.626 m)      General: The patient is awake, alert and appears not in acute distress. The patient is well groomed. Head: Normocephalic, atraumatic. Neck is supple. Mallampati 3 , large tongue , pale ,  neck circumference:15 inches . Nasal airflow not fully patent.  Retrognathia is  seen.  Dental status: biological  Cardiovascular:  Regular rate and cardiac rhythm by pulse,  without distended neck veins. Respiratory: Lungs are clear to auscultation.  Skin:  Without evidence of ankle edema, or rash. Trunk: The patient is in pain.    NEUROLOGIC EXAM:  The patient is awake and alert, oriented to place and time.   Memory subjective described as intact.  Attention span & concentration ability appears normal.  Speech is fluent,  without  dysarthria, dysphonia or aphasia.  Mood and affect are appropriate.   Cranial nerves: no loss of smell or taste reported  Pupils are  equal and briskly reactive to light. Funduscopic exam deferred .  Extraocular movements in vertical and horizontal planes were intact and without nystagmus. No Diplopia. Visual fields by finger perimetry are intact. Hearing was intact to soft voice and finger rubbing.    Facial sensation intact to fine touch.  Facial motor strength is symmetric and tongue and uvula move midline.  Neck ROM : rotation, tilt and flexion extension were normal for age and shoulder shrug was symmetrical.    Motor exam:  symmetric grip strength . The patient walked to the exam table, climbed up with some difficulties. Rested in supine on one pillow, had no tension or neuralgic pain as her scalp and neck was palpated.    Sensory:  vibration was normal.  Proprioception tested in the upper extremities was normal.   Coordination: Rapid alternating movements in the fingers/hands were of normal speed.  The Finger-to-nose maneuver was intact without evidence of ataxia, dysmetria or tremor.   Gait and station:  intact  Deep tendon reflexes: in the  upper and lower extremities are attenuated      ASSESSMENT AND PLAN 77 y.o. year old female  here with:    1) several month history of new onset severe headaches that arise every night after she goes to bed.  The headaches seem to arise from the nape of the neck and behind both ears affecting both parts equally, and not associated with flushing, eye tearing, runny nose no vision changes no vertigo, no hearing loss no photo or phonophobia.  No nausea.  She reports that when she sits up she does not of the symptoms.  Interestingly she has every night the same kind of headache and it will last from the onset time into the morning hours.  She feels that her head is heavy she has trouble to lift it, describes that she has to use her hands to lift her head off the pillow.  I do not see any evidence that she has seen somebody for treatment of cervicalgia.  However the patient  describes not so much neck pain but head pain.  Plan I have to approach is #1 I would like to obtain a home sleep test I want to know if she has significant enough hypoxia that would relate to her sleep position but could increase her headaches, this is often associated with apnea and most often in patients that have a history of either asthma or COPD.  My other concern is that she may have rebound pain  if she took in the past a lot of nonsteroidal medications to treat other headaches and neck pain etc.  She is an establsihed neurosurgical and neurology patient at [facility] I will ask her PCP to send her to the  pain center on [address] for support.  She has been established there and they may be able to treat her for occipital neuralgia on paraspinal tension pain.   I plan to follow up either personally or through our NP within 6 months.   I would like to thank Verlon Au, MD and Verlon Au, Md [address] [facility] [place],  [place] [address] for allowing me to meet with and to take care of this pleasant patient.     After spending a total time of  50  minutes face to face and additional time for physical and neurologic examination, review of laboratory studies,  personal review of imaging studies, reports and results of other testing and review of referral information / records as far as provided in visit,   Electronically signed by: Melvyn Novas, MD 03/20/2023 2:25 PM  [facility] Board certified by The [employer] of Sleep Medicine and Diplomate of the [employer] of Sleep Medicine. Board certified In Neurology through the ABPN, Fellow of the [employer] of Neurology.

## 2023-03-20 NOTE — Patient Instructions (Signed)
Plan I have to approach is #1 I would like to obtain a home sleep test I want to know if she has significant enough hypoxia that would relate to her sleep position but could increase her headaches, this is often associated with apnea and most often in patients that have a history of either asthma or COPD.  Cervicogenic Headache  In a cervicogenic headache, the pain moves from your neck to your head. Most cervicogenic headaches start in the upper part of the neck with the first three cervical bones (cervical vertebrae). What are the causes? The most common cause of this condition is a traumatic injury to the bones and tissues in your neck (cervical spine). Whiplash is an example of a cervical spine injury. Other causes include: Arthritis. Broken bone (fracture). Infection. Tumor. What are the signs or symptoms? The most common symptoms are neck and head pain. The pain is often located on one side. In some cases, there may be head pain without neck pain. Pain may be felt in the neck, back or side of the head, face, or behind the eyes. Other symptoms include: Limited movement in the neck. Arm or shoulder pain. How is this diagnosed? This condition may be diagnosed based on: Your symptoms. A physical exam. An injection that blocks nerve signals (diagnostic nerve block). Imaging tests, such as: X-rays. CT scan. MRI. A cervicogenic headache is diagnosed when a cause can be found in the cervical spine and other causes of headaches can be ruled out. How is this treated? Treatment for this condition may depend on the underlying condition. Treatment may include: Medicines, such as: NSAIDs, such as ibuprofen. Muscle relaxants. Physical therapy. Massage therapy. Complementary therapies, such as: Biofeedback. Meditation. Acupuncture. Nerve block injections to reduce the pain. Botulinum toxin injections. Your treatment plan may involve working with a pain management team that includes your  primary health care provider, a pain management specialist, a neurologist, and a physical therapist. Follow these instructions at home: Take over-the-counter and prescription medicines only as told by your health care provider. Do exercises at home as told by your physical therapist. Return to your normal activities as told by your health care provider. Ask your health care provider what activities are safe for you. Avoid activities that trigger your headaches. Maintain good neck support and posture at home and at work. Keep all follow-up visits. This is important. Contact a health care provider if: You have headaches that are getting worse and happening more often. You have headaches with any of the following: Fever. Numbness. Weakness. Dizziness. Nausea or vomiting. Get help right away if: You have a sudden and severe headache. This symptom may be an emergency. Get help right away. Call 911. Do not wait to see if this symptom will go away. Do not drive yourself to the hospital. Summary A cervicogenic headache is a headache caused by a condition that affects the bones and tissues in your cervical spine. Your health care provider may diagnose this condition with a physical exam, a diagnostic nerve block, and imaging tests. Treatment may include medicine to reduce pain and inflammation, physical therapy, and nerve block injections. Complementary therapies, such as acupuncture and meditation, may be added to other treatments. Your treatment plan may involve working with a pain management team that includes your primary health care provider, a pain management specialist, a neurologist, and a physical therapist. This information is not intended to replace advice given to you by your health care provider. Make sure you discuss any  questions you have with your health care provider. Document Revised: 09/21/2020 Document Reviewed: 09/21/2020 Elsevier Patient Education  2024 [employer].

## 2023-03-21 ENCOUNTER — Other Ambulatory Visit (HOSPITAL_COMMUNITY)
Admission: RE | Admit: 2023-03-21 | Discharge: 2023-03-21 | Disposition: A | Discharge status: Home / Self Care | Payer: No Typology Code available for payment source | Source: Ambulatory Visit | Attending: Interventional Radiology | Admitting: Interventional Radiology

## 2023-03-21 ENCOUNTER — Ambulatory Visit
Admission: RE | Admit: 2023-03-21 | Discharge: 2023-03-21 | Disposition: A | Discharge status: Home / Self Care | Payer: No Typology Code available for payment source | Source: Ambulatory Visit | Attending: Otolaryngology | Admitting: Otolaryngology

## 2023-03-21 DIAGNOSIS — E041 Nontoxic single thyroid nodule: Secondary | ICD-10-CM | POA: Insufficient documentation

## 2023-03-25 LAB — CYTOLOGY - NON PAP

## 2023-04-07 ENCOUNTER — Telehealth: Payer: Self-pay | Admitting: Neurology

## 2023-04-07 NOTE — Telephone Encounter (Signed)
 Docia Barrier: [id number] exp. 04/07/23-06/05/23 sent to GI [phone]

## 2023-04-08 ENCOUNTER — Other Ambulatory Visit: Payer: Self-pay | Admitting: Family Medicine

## 2023-04-08 DIAGNOSIS — Z1231 Encounter for screening mammogram for malignant neoplasm of breast: Secondary | ICD-10-CM

## 2023-04-15 ENCOUNTER — Telehealth: Payer: Self-pay | Admitting: *Deleted

## 2023-04-15 NOTE — Telephone Encounter (Signed)
 Received a notification from the sleep lab as they went to schedule the patient for HST that she doesn't want to move forward with sleep study at this time.   Called patient to schedule HST; she asked that I cancel the order as that will not help my headaches. She also said her headaches have been better overall.

## 2023-05-15 ENCOUNTER — Ambulatory Visit
Admission: RE | Admit: 2023-05-15 | Discharge: 2023-05-15 | Disposition: A | Discharge status: Home / Self Care | Payer: No Typology Code available for payment source | Source: Ambulatory Visit | Attending: Family Medicine

## 2023-05-15 DIAGNOSIS — Z1231 Encounter for screening mammogram for malignant neoplasm of breast: Secondary | ICD-10-CM

## 2023-12-12 ENCOUNTER — Ambulatory Visit (INDEPENDENT_AMBULATORY_CARE_PROVIDER_SITE_OTHER)

## 2023-12-12 ENCOUNTER — Ambulatory Visit (HOSPITAL_COMMUNITY)
Admission: EM | Admit: 2023-12-12 | Discharge: 2023-12-12 | Disposition: A | Discharge status: Home / Self Care | Attending: Emergency Medicine | Admitting: Emergency Medicine

## 2023-12-12 ENCOUNTER — Encounter (HOSPITAL_COMMUNITY): Payer: Self-pay

## 2023-12-12 DIAGNOSIS — G8929 Other chronic pain: Secondary | ICD-10-CM

## 2023-12-12 DIAGNOSIS — M25561 Pain in right knee: Secondary | ICD-10-CM | POA: Diagnosis not present

## 2023-12-12 DIAGNOSIS — M25461 Effusion, right knee: Secondary | ICD-10-CM

## 2023-12-12 DIAGNOSIS — M1990 Unspecified osteoarthritis, unspecified site: Secondary | ICD-10-CM

## 2023-12-12 NOTE — ED Provider Notes (Signed)
 [facility]    CSN: [id number] Arrival date & time: 12/12/23  9056      History   Chief Complaint Chief Complaint  Patient presents with   Leg Pain    HPI Wanda Sanders is a 78 y.o. female.   Patient is in today with chronic right knee pain for the past 6 months.  Patient states that she works in [employer] and after her 4-hour shifts her right knee and lower leg have extreme pain and swelling.  Patient states that she had surgery to the left knee but has never had any problems with the right.  Has been told years ago she does have arthritis to the right knee.  Denies any known injury.  Has been taking Aleve at home with no relief    Past Medical History:  Diagnosis Date   Hypertension     Patient Active Problem List   Diagnosis Date Noted   Essential hypertension 12/30/2017   HLD (hyperlipidemia) 12/30/2017   Type 2 diabetes mellitus (HCC) 12/30/2017    Past Surgical History:  Procedure Laterality Date   ABDOMINAL HYSTERECTOMY     BACK SURGERY     BRAIN TUMOR EXCISION      OB History   No obstetric history on file.      Home Medications    Prior to Admission medications   Medication Sig Start Date End Date Taking? Authorizing Provider  amLODipine (NORVASC) 5 MG tablet Take 5 mg by mouth daily. 10/10/15   [provider]  atorvastatin (LIPITOR) 10 MG tablet Take 10 mg by mouth daily. 08/01/15   [provider]  CALCIUM PO Take 1 tablet by mouth daily.    [provider]  metFORMIN (GLUCOPHAGE-XR) 500 MG 24 hr tablet Take 500 mg by mouth daily. 10/13/15   [provider]  methylPREDNISolone  (MEDROL  DOSEPAK) 4 MG TBPK tablet tad 12/30/17   Maranda Jamee Jacob, MD  Naproxen Sodium (ALEVE) 220 MG CAPS Take by mouth.    [provider]  tiZANidine  (ZANAFLEX ) 2 MG tablet Take 1 tablet (2 mg total) by mouth 3 times/day as needed-between meals & bedtime for muscle spasms. 09/18/17   Tellis Laneta NOVAK, NP     Family History Family History  Problem Relation Age of Onset   Hypertension Other    Diabetes Other    BRCA 1/2 Neg Hx    Breast cancer Neg Hx     Social History Social History   Tobacco Use   Smoking status: Former  Substance Use Topics   Alcohol use: No   Drug use: No     Allergies   Iohexol   Review of Systems Review of Systems  Constitutional: Negative.   Respiratory: Negative.    Cardiovascular: Negative.   Gastrointestinal: Negative.   Musculoskeletal:  Positive for joint swelling.       Swelling and pain to the right knee that radiates down her right leg to her foot  Neurological: Negative.      Physical Exam Triage Vital Signs ED Triage Vitals  Encounter Vitals Group     BP 12/12/23 1109 (!) 149/83     Girls Systolic BP Percentile --      Girls Diastolic BP Percentile --      Boys Systolic BP Percentile --      Boys Diastolic BP Percentile --      Pulse Rate 12/12/23 1109 68     Resp 12/12/23 1109 16  Temp 12/12/23 1109 98 F (36.7 C)     Temp Source 12/12/23 1109 Oral     SpO2 12/12/23 1109 98 %     Weight --      Height --      Head Circumference --      Peak Flow --      Pain Score 12/12/23 1107 8     Pain Loc --      Pain Education --      Exclude from Growth Chart --    No data found.  Updated Vital Signs BP (!) 149/83 (BP Location: Left Arm)   Pulse 68   Temp 98 F (36.7 C) (Oral)   Resp 16   SpO2 98%   Visual Acuity Right Eye Distance:   Left Eye Distance:   Bilateral Distance:    Right Eye Near:   Left Eye Near:    Bilateral Near:     Physical Exam Constitutional:      Appearance: Normal appearance.  Cardiovascular:     Rate and Rhythm: Normal rate.     Pulses: Normal pulses.  Pulmonary:     Effort: Pulmonary effort is normal.  Musculoskeletal:        General: Swelling and tenderness present.     Right lower leg: Edema present.     Comments: Decreased flexion due to pain and edema.  Patient limping with  weightbearing to right knee.  Warm to touch no signs of cellulitis.  Strong pedal pulses +2 edema noted to right anterior popliteal area  Skin:    General: Skin is warm and dry.     Capillary Refill: Capillary refill takes 2 to 3 seconds.     Findings: No erythema or rash.  Neurological:     General: No focal deficit present.     Mental Status: She is alert.      UC Treatments / Results  Labs (all labs ordered are listed, but only abnormal results are displayed) Labs Reviewed - No data to display  EKG   Radiology DG Knee Complete 4 Views Right Result Date: 12/12/2023 EXAM: 4 VIEW(S) XRAY OF THE RIGHT KNEE 12/12/2023 12:37:28 PM COMPARISON: None available. CLINICAL HISTORY: Pain, swelling x6 months. Pt states right leg pain from the top of her foot up to her knee. States she has the pain for the past 6 months. States she has been taking alleve at home with no relief. Pt ambulates with a limp. FINDINGS: BONES AND JOINTS: No acute fracture. Marginal osteophytosis along the femorotibial compartments. There is mild femorotibial joint space narrowing and patellofemoral osteophytosis. There is chondrocalcinosis noted. No joint effusion. SOFT TISSUES: No significant soft tissue swelling. Mild vascular calcifications. IMPRESSION: 1. Tricompartmental right knee osteoarthrosis as above. 2. Chondrocalcinosis. Electronically signed by: Donnice Mania MD 12/12/2023 12:42 PM EDT RP Workstation: [id number]    Procedures Procedures (including critical care time)  Medications Ordered in UC Medications - No data to display  Initial Impression / Assessment and Plan / UC Course  I have reviewed the triage vital signs and the nursing notes.  Pertinent labs & imaging results that were available during my care of the patient were reviewed by me and considered in my medical decision making (see chart for details).     Take Tylenol as needed for pain You will need to follow-up with orthopedic Where  support hose daily Discussed with patient about wearing a knee brace and taking some steroids patient would like to follow-up with orthopedic before  taking any steroids or applying any braces did explain patient can also get a knee sleeve over-the-counter at any [facility] or [facility] Expressed to her she can elevate the leg continue to take Aleve as needed  Final Clinical Impressions(s) / UC Diagnoses   Final diagnoses:  Chronic pain of right knee  Arthritis     Discharge Instructions      Take Tylenol as needed for pain You will need to follow-up with orthopedic Where support hose daily Elevate leg to help with swelling     ED Prescriptions   None    I have reviewed the PDMP during this encounter.   Merilee Andrea CROME, NP 12/12/23 1300

## 2023-12-12 NOTE — Discharge Instructions (Addendum)
 Take Tylenol as needed for pain You will need to follow-up with orthopedic Where support hose daily Elevate leg to help with swelling

## 2023-12-12 NOTE — ED Triage Notes (Signed)
 Pt states right leg pain from the top of her foot up to her knee.  States she has the pain for the past 6 months.  States she has been taking alleve at home with no relief.  Pt ambulates with a limp.

## 2023-12-15 ENCOUNTER — Emergency Department (HOSPITAL_COMMUNITY): Admission: EM | Admit: 2023-12-15 | Discharge: 2023-12-15 | Disposition: A | Discharge status: Home / Self Care

## 2023-12-15 ENCOUNTER — Other Ambulatory Visit: Payer: Self-pay

## 2023-12-15 DIAGNOSIS — I10 Essential (primary) hypertension: Secondary | ICD-10-CM | POA: Insufficient documentation

## 2023-12-15 DIAGNOSIS — G8929 Other chronic pain: Secondary | ICD-10-CM

## 2023-12-15 DIAGNOSIS — M25561 Pain in right knee: Secondary | ICD-10-CM | POA: Diagnosis present

## 2023-12-15 DIAGNOSIS — M25461 Effusion, right knee: Secondary | ICD-10-CM | POA: Insufficient documentation

## 2023-12-15 MED ORDER — NAPROXEN 500 MG PO TABS: 500.0000 mg | ORAL_TABLET | Freq: Two times a day (BID) | ORAL | 0 refills | Status: DC

## 2023-12-15 MED ORDER — NAPROXEN 500 MG PO TABS: 500.0000 mg | ORAL_TABLET | Freq: Two times a day (BID) | ORAL | 0 refills | Status: AC

## 2023-12-15 NOTE — ED Notes (Signed)
 MD Simon and MD Dionisio notified of pt's increasing blood pressure

## 2023-12-15 NOTE — Telephone Encounter (Signed)
 Copied from CRM #[id number]. Topic: Clinical Concerns - Medical Question >> Dec 15, 2023  8:07 AM Darice ORN wrote: Placencia, Shenea M is calling for clinical concerns (Ask: What symptoms are you calling about today, AND how long have you had these symptoms? Must Review HPKW list for symptoms) Document Name of Triage Nurse/BH Rep taking the call when applicable)   Include all details related to the request(s) below: Patient was treated at the Urgent Care on Friday for right knee pain They found she has fluid around her knee Can Dr Jolee work her in today and remove the fluid? Please advise    Confirm and type the Best Contact Number below:  Patient/caller contact number:    [phone]         [] Home  [x] Mobile  [] Work  []  Other   [x]  Okay to leave a voicemail   Medication List:  Current Outpatient Medications:  .  amLODIPine (NORVASC) 5 mg tablet, TAKE 1 TABLET BY MOUTH EVERY DAY, Disp: 90 tablet, Rfl: 1 .  aspirin 81 mg EC tablet, Take  by mouth Once Daily., Disp: , Rfl:  .  metFORMIN (GLUCOPHAGE) 500 mg tablet, Take 1 tablet (500 mg total) by mouth in the morning and 1 tablet (500 mg total) in the evening. Take with meals., Disp: 180 tablet, Rfl: 1     Medication Request/Refills: Pharmacy Information (if applicable)   []  Not Applicable       []  Pharmacy listed  Send Medication Request to:                                                 []  Pharmacy not listed (added to pharmacy list in Epic) Send Medication Request to:      Listed Pharmacies: [id number] - [place], [place] - [address] - PHONE: [phone] - FAX: [phone]

## 2023-12-15 NOTE — ED Provider Notes (Signed)
 [place] EMERGENCY DEPARTMENT AT [facility] Provider Note HPI Wanda Sanders is a 78 y.o. female with a medical history of HTN who presents emergency department for 6 months of right knee pain.  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  She has noticed worsening pain over the last week.  Denies fevers.  She is still able to ambulate.  Denies numbness in the leg.  She was seen in urgent care several days ago where an x-ray was performed and she was told she had fluid in her knee.  She was told to come to the emergency department to drain the fluid.  Past Medical History:  Diagnosis Date   Hypertension    Past Surgical History:  Procedure Laterality Date   ABDOMINAL HYSTERECTOMY     BACK SURGERY     BRAIN TUMOR EXCISION      Review of Systems Pertinent positives and negative findings are listed as part of the History of Present Illness and MDM  Physical Exam Vitals:   12/15/23 1837 12/15/23 1845 12/15/23 1915 12/15/23 1921  BP: (!) 177/111 (!) 210/86 (!) 183/83 (!) 174/78  Pulse: 70 62 (!) 59 62  Resp:  17    Temp:  98.4 F (36.9 C)    TempSrc:  Oral    SpO2: 100% 100% 100% 100%  Weight:      Height:         Constitutional Nursing notes reviewed Vital signs reviewed  HEENT No obvious trauma Pupils round, equal, and reactive to light. Pupils cross midline Neck supple  Respiratory Effort normal Breathing well on room air CTAB  CV Normal rate and rhythm  No pitting edema  Abdomen Soft, non-tender, non-distended No peritonitis  MSK 5/5 strength in right hip flexion, right knee flexion/extension, right ankle plantarflexion/dorsiflexion 2+ palpable DP pulse in the right Sensation to light touch throughout the right lower extremity intact Patient able to ambulate with a normal gait and bear full weight on the right leg No tenderness to palpation over the knee but there is an obvious joint effusion No overlying erythema or  warmth  Neuro Awake and alert Pupils cross midline Moving all extremities    MDM:  Initial Differential Diagnoses includes traumatic knee effusion, inflammatory effusion, septic effusion, OA, fracture, dislocation, muscular/tendinous injury  I reviewed the patient's vitals, the nursing triage note and evaluated the patient at bedside.  Patient presents with 4 months of worsening right knee pain.  She is still able to ambulate.  Her leg is neurovascular intact as detailed above with a 2+ palpable DP pulse.  She has full strength in all joints of the right lower extremity.  No warmth, erythema or tenderness over the knee.  Denies fevers and she is afebrile here.  I have no suspicion for septic knee given the above.  She does have an obvious effusion.  She states that she had an x-ray several days ago that showed no fracture but did show an effusion.  She is here for therapeutic drain of the knee.  I discussed the risks of therapeutic arthrocentesis in the emergency department including bleeding, introducing infection, and high likelihood of reaccumulation of the fluid.  After discussion, we will not pursue therapeutic intervention.  I counseled the patient on naproxen  therapy.  She has no contraindications to this.  She was told to stop taking other NSAIDs.  She will follow-up with her PCP for further workup  and care.  Of note, the patient is hypertensive in the emergency department.  She has a history of hypertension and took all of her home blood pressure medications today.  She states that she is nervous because she thought we would inject a needle into her knee.  She remains asymptomatic and has no headache, chest pain, shortness of breath.  Likely is having whitecoat hypertension.  Procedures: Procedures  Medications administered in the ED: Medications - No data to display   Impression: 1. Effusion of right knee   2. Chronic pain of right knee      Patient's presentation is most  consistent with acute presentation with potential threat to life or bodily function.  Disposition: ED Disposition:  Discharge   Discharge: Patient is felt to be medically appropriate for discharge at this time. Patient was instructed to follow up with their primary care doctor/specialists listed above for re-evaluation. Patient was given strict return precautions.  ED Discharge Orders          Ordered    naproxen  (NAPROSYN ) 500 MG tablet  2 times daily        12/15/23 RETHA Dionisio Blunt, MD 12/15/23 RANDALL    Simon Lavonia SAILOR, MD 12/15/23 2234

## 2023-12-15 NOTE — Discharge Instructions (Addendum)
 You were seen today for right knee pain and fluid in your knee.  While you were here we did a physical exam which was reassuring.  You have no evidence of infection to your knee.  You may walk as tolerated.  You may apply ice to the area for up to 20 minutes at a time.  Please start taking naproxen , 500 mg twice a day for your pain.  Do not take any other NSAID such as ibuprofen or Aleve  with this medication.  Please follow-up with your primary care doctor for reevaluation.  Come back to the emergency department if you are unable to bear weight on the leg, develop fevers, or unable to move the knee or if you have any other reason to believe an emergency care.

## 2023-12-15 NOTE — ED Triage Notes (Signed)
 Patient went to UC on Friday for eval of R knee pain x 6 months. She was told she has fluid on her knee that needs to be drained, so she is here for that today.

## 2023-12-15 NOTE — ED Notes (Signed)
 Pt was discharged by Raymar RN

## 2024-01-23 NOTE — Telephone Encounter (Signed)
 Copied from CRM #[id number]. Topic: Medical Rec/Forms - Medical Rec/Forms [place] >> Jan 23, 2024  9:47 AM Amy S wrote: Crystal/[facility] is calling other request    Include all details related to the request(s) below:  Crystal with [facility] was calling to follow up on the form they had faxed on Monday 01/19/24.  She said the form was regarding the recommendation for a Statin Drug.   Confirm and type the Best Contact Number below:  Patient/caller contact number:   [phone] ext [phone]          [] Home  [] Mobile  [x] Work [] Other   [] Okay to leave a voicemail   Medication List:  Current Outpatient Medications:  .  amLODIPine (NORVASC) 5 mg tablet, TAKE 1 TABLET BY MOUTH EVERY DAY, Disp: 90 tablet, Rfl: 1 .  aspirin 81 mg EC tablet, Take  by mouth Once Daily., Disp: , Rfl:  .  HYDROcodone-acetaminophen (NORCO) 5-325 mg per tablet, Take 1 tablet by mouth 2 (two) times a day as needed for moderate pain (4-6) or severe pain (7-10)., Disp: 40 tablet, Rfl: 0 .  meloxicam  (MOBIC ) 15 mg tablet, Take 1 tablet (15 mg total) by mouth daily. With food.  As needed for pain.  Start after you finish prednisone., Disp: 30 tablet, Rfl: 0 .  metFORMIN (GLUCOPHAGE) 500 mg tablet, Take 1 tablet (500 mg total) by mouth in the morning and 1 tablet (500 mg total) in the evening. Take with meals., Disp: 180 tablet, Rfl: 1     Medication Request/Refills: Pharmacy Information (if applicable)   [] Not Applicable       []  Pharmacy listed  Send Medication Request to:                                                 [] Pharmacy not listed (added to pharmacy list in Epic) Send Medication Request to:      [facility]ed Pharmacies: [id number] - [place], [place] - [address] - PHONE: [phone] - FAX: [phone]

## 2024-01-23 NOTE — Telephone Encounter (Signed)
 Form in sign folder on desk.

## 2024-01-25 ENCOUNTER — Ambulatory Visit (HOSPITAL_COMMUNITY)
Admission: EM | Admit: 2024-01-25 | Discharge: 2024-01-25 | Disposition: A | Discharge status: Home / Self Care | Attending: Nurse Practitioner | Admitting: Nurse Practitioner

## 2024-01-25 ENCOUNTER — Other Ambulatory Visit: Payer: Self-pay

## 2024-01-25 ENCOUNTER — Encounter (HOSPITAL_COMMUNITY): Payer: Self-pay | Admitting: *Deleted

## 2024-01-25 DIAGNOSIS — T148XXA Other injury of unspecified body region, initial encounter: Secondary | ICD-10-CM | POA: Diagnosis not present

## 2024-01-25 DIAGNOSIS — S7012XA Contusion of left thigh, initial encounter: Secondary | ICD-10-CM

## 2024-01-25 MED ORDER — TIZANIDINE HCL 2 MG PO TABS: 2.0000 mg | ORAL_TABLET | Freq: Three times a day (TID) | ORAL | 0 refills | Status: AC | PRN

## 2024-01-25 MED ORDER — NAPROXEN 500 MG PO TABS: 500.0000 mg | ORAL_TABLET | Freq: Two times a day (BID) | ORAL | 0 refills | Status: AC

## 2024-01-25 NOTE — Discharge Instructions (Addendum)
 You have a bruise (contusion) of the muscle in your left thigh from your fall. This type of injury is painful but should improve as the muscle heals over the next several days.  Take the naproxen  twice a day with food to help with pain and inflammation. You may use the tizanidine  as needed to help relax the muscle if it feels tight or spasms. You can also take Tylenol if you need extra pain relief, but do not take ibuprofen, Advil, or Aleve  while you are already taking naproxen , as this can irritate the stomach. You may use ice packs or heat on the thigh. Ice is helpful in the first 24-48 hours to reduce soreness and swelling. After that, heat may help relax the muscle. Use whichever gives you better relief. Try to move gently and avoid activities that increase pain. Rest the leg as needed, but keep doing light walking to avoid stiffness. Your pain should slowly improve over the next few days. If your pain is not improving within one week, or if you feel like the muscle is getting tighter or harder to move, follow up with your primary care provider.  Go to the emergency room right away if you notice severe swelling of the leg, the pain suddenly becomes much worse, you cannot walk or put weight on the leg, you develop numbness or tingling that does not go away, or you have any other sudden concerning symptoms. If you are unsure, it is better to be checked.

## 2024-01-25 NOTE — ED Triage Notes (Addendum)
 PT has LT posterior thigh pain . PT reports she fell on left side yesterday while ouside. Pt reports she fell onto concrete steps at [employer]. PT took OTC for pain with out relief.

## 2024-01-25 NOTE — ED Provider Notes (Signed)
 [facility]    CSN: [id number] Arrival date & time: 01/25/24  1439      History   Chief Complaint Chief Complaint  Patient presents with   Fall    HPI Wanda Sanders is a 78 y.o. female.   Discussed the use of AI scribe software for clinical note transcription with the patient, who gave verbal consent to proceed.   Patient presents with left thigh pain following a fall yesterday. She reports tripping on a step, losing her balance, and landing on her left side. She denies any head strike during the fall. The pain is localized to the posterior left thigh and is described as a catching sensation that becomes significantly worse with movement, especially when standing up or lifting the leg. Pain is mild at rest but increases notably with activity. She denies numbness, tingling, or radiation down the leg. She took Aleve  last night without relief. She also reports a minor abrasion to the right elbow but denies additional injuries or other symptoms.   The following sections of the patient's history were reviewed and updated as appropriate: allergies, current medications, past family history, past medical history, past social history, past surgical history, and problem list.     Past Medical History:  Diagnosis Date   Hypertension     Patient Active Problem List   Diagnosis Date Noted   Essential hypertension 12/30/2017   HLD (hyperlipidemia) 12/30/2017   Type 2 diabetes mellitus (HCC) 12/30/2017    Past Surgical History:  Procedure Laterality Date   ABDOMINAL HYSTERECTOMY     BACK SURGERY     BRAIN TUMOR EXCISION      OB History   No obstetric history on file.      Home Medications    Prior to Admission medications   Medication Sig Start Date End Date Taking? Authorizing Provider  amLODipine (NORVASC) 5 MG tablet Take 5 mg by mouth daily. 10/10/15  Yes [provider]  atorvastatin (LIPITOR) 10 MG tablet Take 10 mg by mouth daily. 08/01/15  Yes  [provider]  CALCIUM PO Take 1 tablet by mouth daily.   Yes [provider]  metFORMIN (GLUCOPHAGE-XR) 500 MG 24 hr tablet Take 500 mg by mouth daily. 10/13/15  Yes [provider]  naproxen  (NAPROSYN ) 500 MG tablet Take 1 tablet (500 mg total) by mouth 2 (two) times daily with a meal. Take with food to avoid stomach upset. Do not take any additional NSAIDs while on this. You may take tylenol in addition to this if needed for extra pain relief. 01/25/24  Yes Iola Lukes, FNP  tiZANidine  (ZANAFLEX ) 2 MG tablet Take 1 tablet (2 mg total) by mouth every 8 (eight) hours as needed for muscle spasms. 01/25/24  Yes Iola Lukes, FNP    Family History Family History  Problem Relation Age of Onset   Hypertension Other    Diabetes Other    BRCA 1/2 Neg Hx    Breast cancer Neg Hx     Social History Social History   Tobacco Use   Smoking status: Former  Substance Use Topics   Alcohol use: No   Drug use: No     Allergies   Iohexol   Review of Systems Review of Systems  Musculoskeletal:  Positive for arthralgias. Negative for back pain and joint swelling.  Skin:  Positive for wound.  Neurological:  Negative for weakness and numbness.  All other systems reviewed and are negative.    Physical Exam  Triage Vital Signs ED Triage Vitals  Encounter Vitals Group     BP 01/25/24 1526 126/74     Girls Systolic BP Percentile --      Girls Diastolic BP Percentile --      Boys Systolic BP Percentile --      Boys Diastolic BP Percentile --      Pulse Rate 01/25/24 1526 74     Resp 01/25/24 1526 20     Temp 01/25/24 1526 98.4 F (36.9 C)     Temp src --      SpO2 01/25/24 1526 96 %     Weight --      Height --      Head Circumference --      Peak Flow --      Pain Score 01/25/24 1522 8     Pain Loc --      Pain Education --      Exclude from Growth Chart --    No data found.  Updated Vital Signs BP 126/74   Pulse 74   Temp 98.4 F (36.9  C)   Resp 20   SpO2 96%   Visual Acuity Right Eye Distance:   Left Eye Distance:   Bilateral Distance:    Right Eye Near:   Left Eye Near:    Bilateral Near:     Physical Exam Vitals reviewed.  Constitutional:      General: She is awake. She is not in acute distress.    Appearance: Normal appearance. She is well-developed. She is not ill-appearing, toxic-appearing or diaphoretic.  HENT:     Head: Normocephalic.     Right Ear: Hearing normal.     Left Ear: Hearing normal.     Nose: Nose normal.     Mouth/Throat:     Mouth: Mucous membranes are moist.  Eyes:     General: Vision grossly intact.     Conjunctiva/sclera: Conjunctivae normal.  Cardiovascular:     Rate and Rhythm: Normal rate and regular rhythm.     Heart sounds: Normal heart sounds.  Pulmonary:     Effort: Pulmonary effort is normal.     Breath sounds: Normal breath sounds and air entry.  Musculoskeletal:        General: Normal range of motion.     Cervical back: Normal range of motion and neck supple.     Left hip: Normal.     Left upper leg: Tenderness present. No swelling, edema, deformity, lacerations or bony tenderness.     Left knee: Normal.     Comments: Left posterior thigh exhibits mild tenderness to palpation. Pain is reproduced with active movement of the left leg. Strength, sensation, and range of motion of the left lower extremity are intact. Neurovascular status is preserved.   Skin:    General: Skin is warm and dry.     Findings: Abrasion present.     Comments: Right posterior elbow with a small superficial abrasion. No surrounding erythema, swelling, or drainage observed. Skin edges clean and intact.   Neurological:     General: No focal deficit present.     Mental Status: She is alert and oriented to person, place, and time.     Sensory: Sensation is intact.     Motor: Motor function is intact.     Coordination: Coordination is intact.     Gait: Gait is intact.  Psychiatric:         Speech: Speech normal.  Behavior: Behavior is cooperative.      UC Treatments / Results  Labs (all labs ordered are listed, but only abnormal results are displayed) Labs Reviewed - No data to display  EKG   Radiology No results found.  Procedures Procedures (including critical care time)  Medications Ordered in UC Medications - No data to display  Initial Impression / Assessment and Plan / UC Course  I have reviewed the triage vital signs and the nursing notes.  Pertinent labs & imaging results that were available during my care of the patient were reviewed by me and considered in my medical decision making (see chart for details).     Patient presents with left posterior thigh pain after tripping on steps and landing on her left side yesterday. Pain is worse with movement and feels like a "catching" sensation, but is mild at rest. Examination shows localized muscle tenderness without bruising, swelling, joint involvement, numbness, or tingling. Hip exam is normal and there is no concern for fracture or nerve injury. The clinical presentation is most consistent with a thigh muscle contusion. Treatment includes naproxen  twice daily with food and tizanidine  as needed for muscle spasm, with the option to use acetaminophen for additional pain control. Patient advised to apply ice and heat alternately and avoid other NSAIDs to reduce the risk of stomach irritation. Symptoms are expected to improve gradually over the next several days. She was instructed to follow up with her primary care provider if pain does not improve within one week or if mobility worsens, and to seek emergency care for severe swelling, inability to bear weight, increasing numbness, or severe uncontrolled pain.  Today's evaluation has revealed no signs of a dangerous process. Discussed diagnosis with patient and/or guardian. Patient and/or guardian aware of their diagnosis, possible red flag symptoms to watch out  for and need for close follow up. Patient and/or guardian understands verbal and written discharge instructions. Patient and/or guardian comfortable with plan and disposition.  Patient and/or guardian has a clear mental status at this time, good insight into illness (after discussion and teaching) and has clear judgment to make decisions regarding their care  Documentation was completed with the aid of voice recognition software. Transcription may contain typographical errors.   Final Clinical Impressions(s) / UC Diagnoses   Final diagnoses:  Abrasion  Contusion of left thigh, initial encounter     Discharge Instructions      You have a bruise (contusion) of the muscle in your left thigh from your fall. This type of injury is painful but should improve as the muscle heals over the next several days.  Take the naproxen  twice a day with food to help with pain and inflammation. You may use the tizanidine  as needed to help relax the muscle if it feels tight or spasms. You can also take Tylenol if you need extra pain relief, but do not take ibuprofen, Advil, or Aleve  while you are already taking naproxen , as this can irritate the stomach. You may use ice packs or heat on the thigh. Ice is helpful in the first 24-48 hours to reduce soreness and swelling. After that, heat may help relax the muscle. Use whichever gives you better relief. Try to move gently and avoid activities that increase pain. Rest the leg as needed, but keep doing light walking to avoid stiffness. Your pain should slowly improve over the next few days. If your pain is not improving within one week, or if you feel like the muscle is  getting tighter or harder to move, follow up with your primary care provider.  Go to the emergency room right away if you notice severe swelling of the leg, the pain suddenly becomes much worse, you cannot walk or put weight on the leg, you develop numbness or tingling that does not go away, or you have  any other sudden concerning symptoms. If you are unsure, it is better to be checked.     ED Prescriptions     Medication Sig Dispense Auth. Provider   naproxen  (NAPROSYN ) 500 MG tablet Take 1 tablet (500 mg total) by mouth 2 (two) times daily with a meal. Take with food to avoid stomach upset. Do not take any additional NSAIDs while on this. You may take tylenol in addition to this if needed for extra pain relief. 20 tablet Iola Lukes, FNP   tiZANidine  (ZANAFLEX ) 2 MG tablet Take 1 tablet (2 mg total) by mouth every 8 (eight) hours as needed for muscle spasms. 12 tablet Iola Lukes, FNP      PDMP not reviewed this encounter.   Iola Lukes, [place] 01/25/24 [phone]

## 2024-04-12 ENCOUNTER — Other Ambulatory Visit: Payer: Self-pay | Admitting: Family Medicine

## 2024-04-12 DIAGNOSIS — Z1231 Encounter for screening mammogram for malignant neoplasm of breast: Secondary | ICD-10-CM

## 2024-05-17 ENCOUNTER — Ambulatory Visit
